# Patient Record
Sex: Female | Born: 1990 | Race: White | Hispanic: No | Marital: Single | Smoking: Never smoker
Health system: Southern US, Community
[De-identification: ages and names within clinical notes are randomized; demographics above are authoritative.]

---
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
## 2010-03-28 IMAGING — CR DG THORACIC SPINE 3V
2 series · 2 of 2 positions shown · non-contrast
Comparison: none

CLINICAL DATA: Pain.  Assess for scoliosis.

THORACIC SPINE - 2 VIEW + SWIMMERS

[view not recorded (1 of 2)]
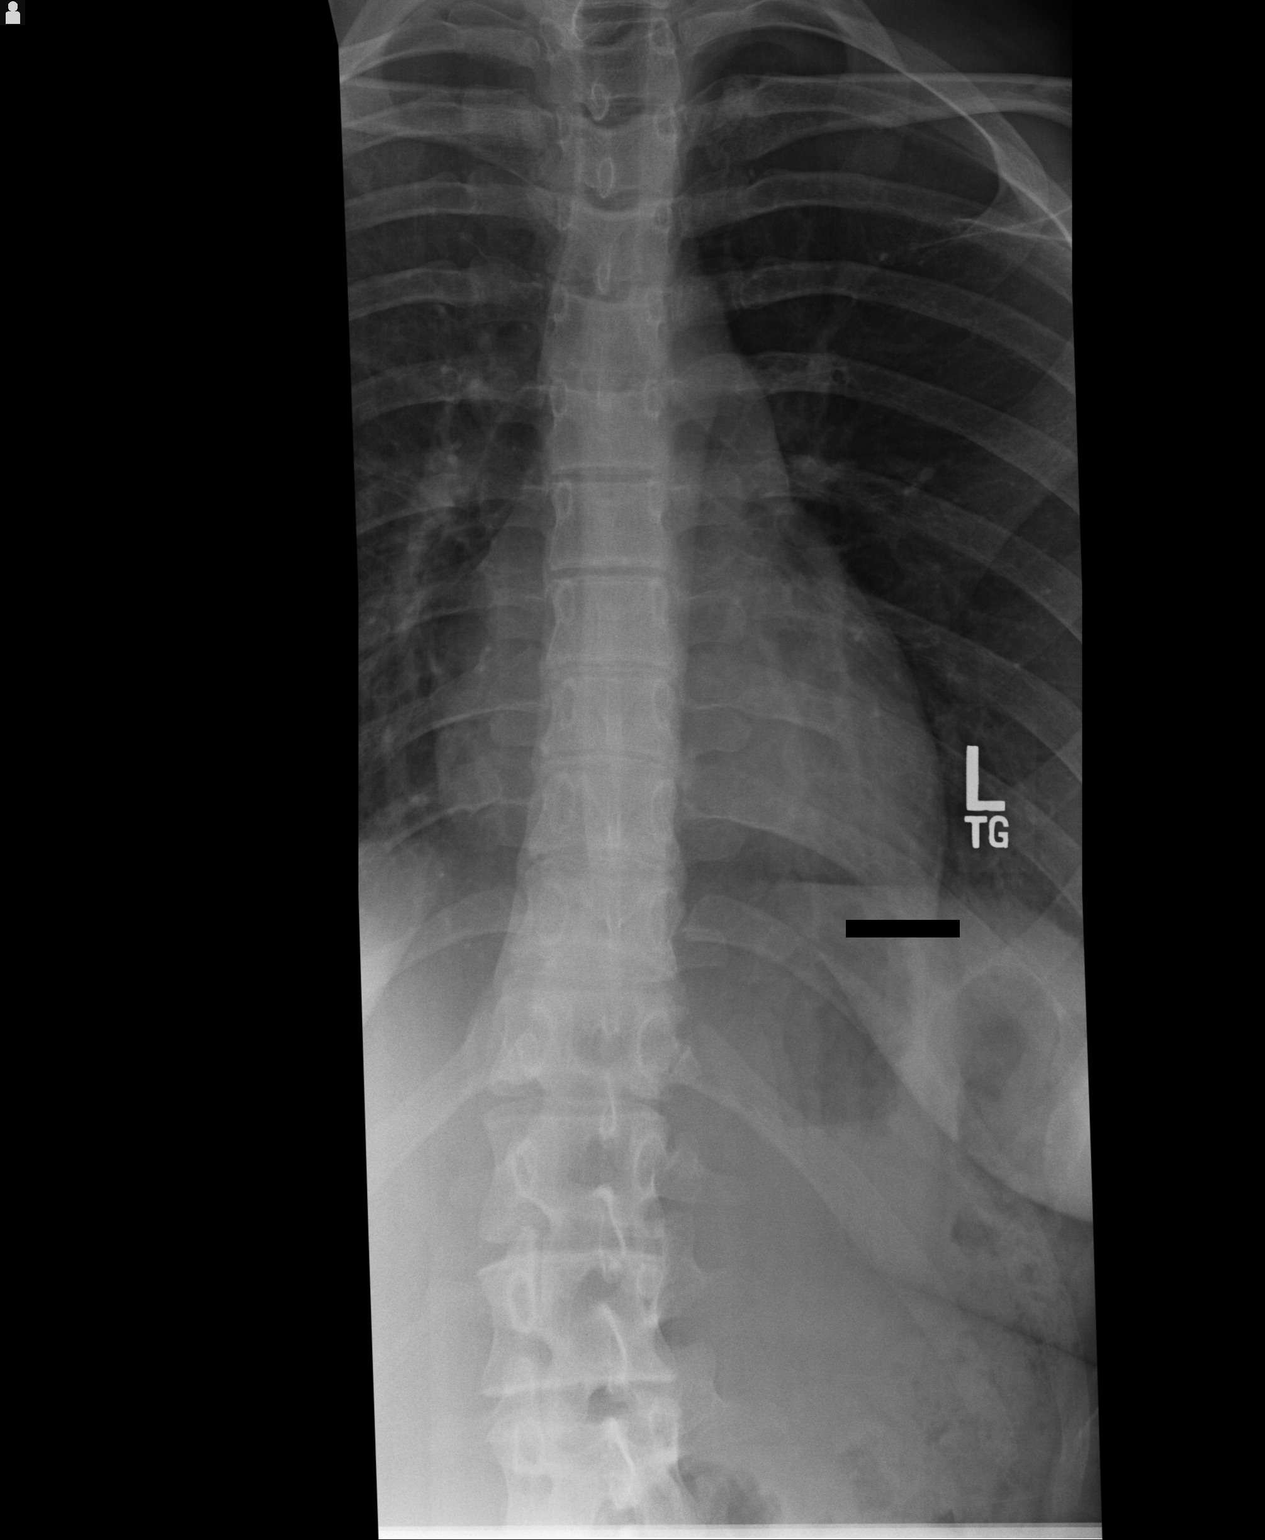

[view not recorded (2 of 2)]
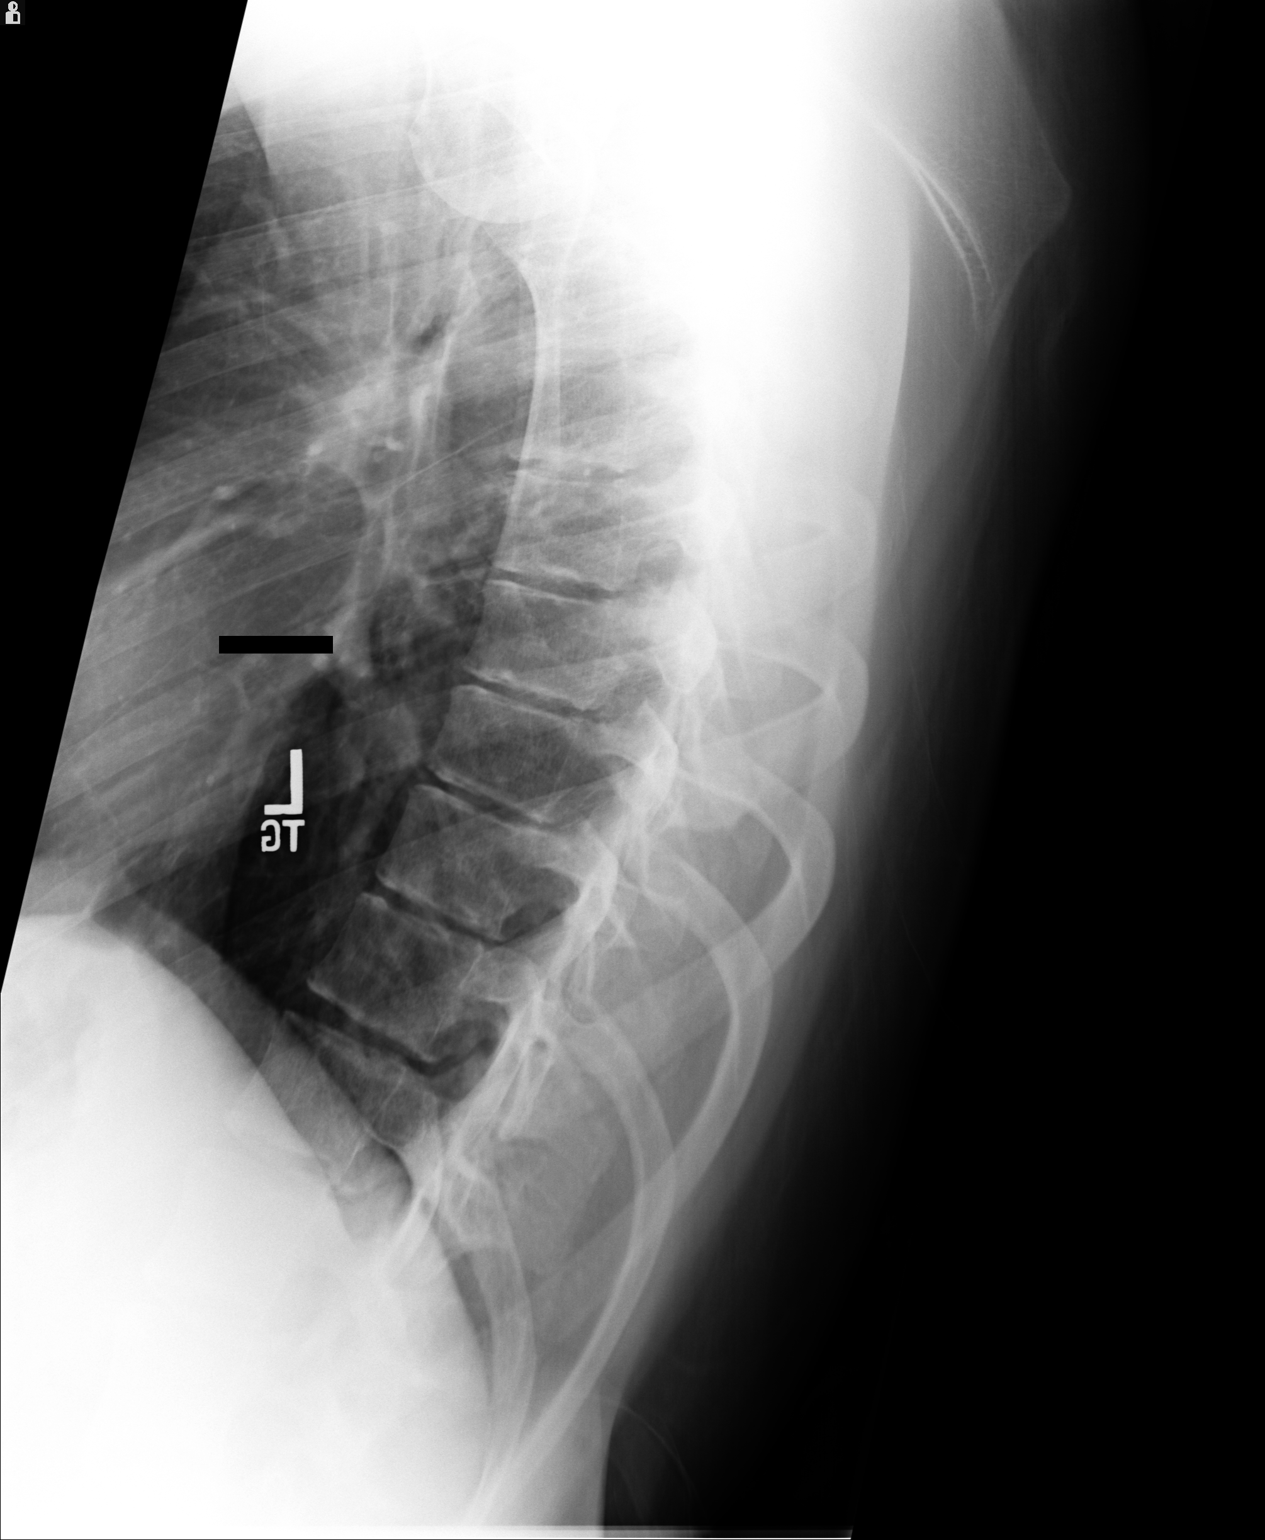

[2 of 2 positions shown; findings below may reference images not displayed]

FINDINGS: Upper thoracic region is not included on the film.
Cervical films showed a dextroscoliosis in that area.  Lower in the
thoracic spine, there is minor S-shaped curvature measured at 7
degrees.  The thoracolumbar curvatures measured at 3 degrees.
Lateral projection shows normal alignment with no disc space
narrowing.
IMPRESSION: Moderate curvatures of the thoracic spine as described above.
Scanogram might be more useful to depict the entire spine.

## 2020-03-26 DIAGNOSIS — F900 Attention-deficit hyperactivity disorder, predominantly inattentive type: Secondary | ICD-10-CM | POA: Insufficient documentation

## 2021-05-06 NOTE — Progress Notes (Signed)
? ? ? ?05/06/2021 ?Sandra Romero ?[id number] ?08-22-1990 ? ? ?CHIEF COMPLAINT: Rumination syndrome ? ?HISTORY OF PRESENT ILLNESS: Sandra Romero is a 31 year old female with a past medical history of anxiety, depression, bulimia x 6 to 7 months 2006, anorexia 2015 -  2019, GERD and anemia.  Past surgical history includes wisdom teeth extraction.  She presents to our office today as referred by Dr. Rachell Romero for further evaluation regarding GERD and rumination syndrome. She initially experienced regurgitation symptoms following 6 to 7 months of actively purging with history of bulemia around age 31 or 31.  Several months after she stopped inducing vomiting, she developed intermittent "involuntary vomiting" which typically occurred if she was not relaxed.  She was treated for possible reflux and she took Prilosec for approximately 1 year without improvement.  Her regurgitation symptoms have worsened over the past 6 months.  She complains of food spontaneously backing up into her esophagus into her mouth which occurs at least once daily shortly after eating for the past 6 months. She denies having associated nausea.  No dry heaves or active vomiting.  No purging.  No specific food triggers.  She denies having heartburn.  She sometimes feels as if food gets stuck in her throat or upper esophagus but she is able to drink water and continue eating during these times.  No upper or lower abdominal pain.  She is passing normal formed brown bowel movement daily.  No rectal bleeding or black stools.  She reports having a remote history of anemia secondary to heavy menses and poor nutrition.  Denies any recent weight loss. ? ?Labs 04/13/2021 (results obtained from patient's phone): Hemoglobin A1c 5.5.  Glucose 97.  Sodium 137.  Potassium 4.5.  BUN 19.  Creatinine 0.85.  Calcium 9.5.  Albumin 4.3.  Alk phos 36.  AST 14.  ALT 12.  Total bili 0.2. ? ?Social History: Single. She is a Pharmacist, hospital, Electrical engineer. Nonsmoker. She  drinks one glass of wine or beer once or twice monthly. No drug use.  ? ?Family History: Mother age 77 had breast cancer and thyroid disease. Father age 65 with history of prediabetes. Maternal grandmother had breast cancer and depression. Paternal grandfather had bladder cancer. Maternal grandfather had MI in his 63's. Brother depression. No fami ? ?Current Outpatient Medications on File Prior to Visit  ?Medication Sig Dispense Refill  ? lisdexamfetamine (VYVANSE) 20 MG capsule Vyvanse 20 mg capsule    ? ?No current facility-administered medications on file prior to visit.  ? ? ?Allergies  ?Allergen Reactions  ? Penicillins Rash  ? ?REVIEW OF SYSTEMS: ?Gen: Denies fever, sweats or chills. No weight loss.  ?CV: Denies chest pain, palpitations or edema. ?Resp: Denies cough, shortness of breath of hemoptysis.  ?GI: See HPI. ?GU : Denies urinary burning, blood in urine, increased urinary frequency or incontinence. ?MS: Denies joint pain, muscles aches or weakness. ?Derm: Denies rash, itchiness, skin lesions or unhealing ulcers. ?Psych: History of anxiety and depression. Remote history of bulimia and anorexia. Heme: Denies bruising, easy bleeding. ?Neuro:  Denies headaches, dizziness or paresthesias. ?Endo:  Denies any problems with DM, thyroid or adrenal function. ? ?PHYSICAL EXAM: ?BP 102/60   Pulse 67   Ht _0  (1.803 m)   Wt 174 lb (78.9 kg)   SpO2 99%   BMI 24.27 kg/m?  ? ?  ?LMP 30 days ago. Copper IUD in place.  ? ?General: 31 year old female in no acute distress. ?Head: Normocephalic and atraumatic. ?Eyes:  Sclerae non-icteric, conjunctive pink. ?Ears: Normal auditory acuity. ?Mouth: Dentition intact. No ulcers or lesions.  ?Neck: Supple, no lymphadenopathy or thyromegaly.  ?Lungs: Clear bilaterally to auscultation without wheezes, crackles or rhonchi. ?Heart: Regular rate and rhythm. No murmur, rub or gallop appreciated.  ?Abdomen: Soft, nontender, non distended. No masses. No hepatosplenomegaly.  Normoactive bowel sounds x 4 quadrants.  ?Rectal: Deferred. ?Musculoskeletal: Symmetrical with no gross deformities. ?Skin: Warm and dry. No rash or lesions on visible extremities. ?Extremities: No edema. ?Neurological: Alert oriented x 4, no focal deficits.  ?Psychological:  Alert and cooperative. Normal mood and affect. ? ?ASSESSMENT AND PLAN: ? ?70) 31 year old female with a remote history of bulimia/anorexia with infrequent regurgitation since the age of 53. Worsening regurgitation without dry heaves or true vomiting which occurs daily for the past 6 months.  Questional dysphagia.  No classic heartburn.  No nausea or abdominal pain. ?-Barium swallow study to rule out esophageal dysmotility ?-EGD to rule out GERD/esophagitis/achalasia.  Possible esophageal dilatation.  EGD benefits and risks discussed including risk with sedation, risk of bleeding, perforation and infection  ?-Continue Omeprazole 20 mg daily for now ?-Recommended eating 4 small snacks sized meals ?-Further recommendation to be determined after the above evaluation completed ? ? ? ? ? ? ?CC:  Fanny Bien, MD ? ? ? ?

## 2021-05-10 ENCOUNTER — Ambulatory Visit: Payer: BC Managed Care – PPO | Admitting: Nurse Practitioner

## 2021-05-10 ENCOUNTER — Encounter: Payer: Self-pay | Admitting: Nurse Practitioner

## 2021-05-10 VITALS — BP 102/60 | HR 67 | Ht 71.0 in | Wt 174.0 lb

## 2021-05-10 DIAGNOSIS — R1110 Vomiting, unspecified: Secondary | ICD-10-CM | POA: Diagnosis not present

## 2021-05-10 NOTE — Progress Notes (Signed)
RADIOLOGY SCHEDULING REQUEST FOR Barium Swallow with tablet sent to [facility] Scheduling via secure staff message. ?Reminder set to ensure appointment gets scheduled. ? ? ? ?

## 2021-05-10 NOTE — Patient Instructions (Addendum)
1) Our office will contact you to schedule a barium swallow study  ? ?2) Proceed with an upper endoscopy as scheduled  ? ?3) Continue Omeprazole 20mg  once daily for now  ? ?4) Recommend eating 4 small snack sized meals  ? ?5) Contact our office if your symptoms worsen  ? ?You have been scheduled for a EGD. Please follow the written instructions given to you at your visit today. ?If you use inhalers (even only as needed), please bring them with you on the day of your procedure. ? ?Thank you for trusting me with your gastrointestinal care!   ? ?Noralyn Pick, CRNP ? ? ? ?BMI: ? ?If you are age 36 or older, your body mass index should be between 23-30. Your Body mass index is 24.27 kg/m?Marland Kitchen If this is out of the aforementioned range listed, please consider follow up with your Primary Care Provider. ? ?If you are age 21 or younger, your body mass index should be between 19-25. Your Body mass index is 24.27 kg/m?Marland Kitchen If this is out of the aformentioned range listed, please consider follow up with your Primary Care Provider.  ? ?MY CHART: ? ?The [facility] providers would like to encourage you to use [facility] to communicate with providers for non-urgent requests or questions.  Due to long hold times on the telephone, sending your provider a message by [facility] may be a faster and more efficient way to get a response.  Please allow 48 business hours for a response.  Please remember that this is for non-urgent requests.  ? ?

## 2021-05-11 NOTE — Progress Notes (Signed)
Agree with the assessment and plan as outlined by Colleen Kennedy-Smith, NP.    Anabia Weatherwax E. Khan Chura, MD [facility]  

## 2021-05-31 ENCOUNTER — Ambulatory Visit (HOSPITAL_COMMUNITY): Payer: BC Managed Care – PPO

## 2021-06-07 ENCOUNTER — Ambulatory Visit (HOSPITAL_COMMUNITY)
Admission: RE | Admit: 2021-06-07 | Discharge: 2021-06-07 | Disposition: A | Discharge status: Home / Self Care | Payer: BC Managed Care – PPO | Source: Ambulatory Visit | Attending: Nurse Practitioner | Admitting: Nurse Practitioner

## 2021-06-07 DIAGNOSIS — R1110 Vomiting, unspecified: Secondary | ICD-10-CM | POA: Diagnosis present

## 2021-06-08 ENCOUNTER — Encounter: Payer: Self-pay | Admitting: Gastroenterology

## 2021-06-13 ENCOUNTER — Encounter: Payer: Self-pay | Admitting: Gastroenterology

## 2021-06-13 ENCOUNTER — Ambulatory Visit (AMBULATORY_SURGERY_CENTER): Payer: BC Managed Care – PPO | Admitting: Gastroenterology

## 2021-06-13 VITALS — BP 99/58 | HR 67 | Temp 98.0°F | Resp 22 | Ht 71.0 in | Wt 174.0 lb

## 2021-06-13 DIAGNOSIS — K200 Eosinophilic esophagitis: Secondary | ICD-10-CM

## 2021-06-13 DIAGNOSIS — R1110 Vomiting, unspecified: Secondary | ICD-10-CM

## 2021-06-13 DIAGNOSIS — K209 Esophagitis, unspecified without bleeding: Secondary | ICD-10-CM | POA: Diagnosis not present

## 2021-06-13 MED ORDER — SODIUM CHLORIDE 0.9 % IV SOLN: 500.0000 mL | Freq: Once | INTRAVENOUS | Status: DC

## 2021-06-13 NOTE — Op Note (Signed)
[facility] ?Patient Name: Sandra RoughenRachel Romero ?Procedure Date: 06/13/2021 2:59 PM ?MRN: [id number] ?Endoscopist: Skylin Kennerson E. Tomasa Randunningham , MD ?Age: 31 ?Referring MD:  ?Date of Birth: 10-22-1990 ?Gender: Female ?Account #: [id number] ?Procedure:                Upper GI endoscopy ?Indications:              Regurgitation ?Medicines:                Monitored Anesthesia Care ?Procedure:                Pre-Anesthesia Assessment: ?                          - Prior to the procedure, a History and Physical  ?                          was performed, and patient medications and  ?                          allergies were reviewed. The patient's tolerance of  ?                          previous anesthesia was also reviewed. The risks  ?                          and benefits of the procedure and the sedation  ?                          options and risks were discussed with the patient.  ?                          All questions were answered, and informed consent  ?                          was obtained. Prior Anticoagulants: The patient has  ?                          taken no previous anticoagulant or antiplatelet  ?                          agents. ASA Grade Assessment: II - A patient with  ?                          mild systemic disease. After reviewing the risks  ?                          and benefits, the patient was deemed in  ?                          satisfactory condition to undergo the procedure. ?                          After obtaining informed consent, the endoscope was  ?  passed under direct vision. Throughout the  ?                          procedure, the patient's blood pressure, pulse, and  ?                          oxygen saturations were monitored continuously. The  ?                          GIF [id number] #[id number] was introduced through the  ?                          mouth, and advanced to the third part of duodenum.  ?                          The upper GI endoscopy was  accomplished without  ?                          difficulty. The patient tolerated the procedure  ?                          well. ?Scope In: ?Scope Out: ?Findings:                 The examined portions of the nasopharynx,  ?                          oropharynx and larynx were normal. ?                          Scattered mild mucosal changes characterized by  ?                          longitudinal markings were found in the lower third  ?                          of the esophagus. Biopsies were taken with a cold  ?                          forceps for histology. Estimated blood loss was  ?                          minimal. ?                          The exam of the esophagus was otherwise normal. ?                          The entire examined stomach was normal. ?                          The examined duodenum was normal. ?Complications:            No immediate complications. ?Estimated Blood Loss:     Estimated blood loss was minimal. ?Impression:               -  The examined portions of the nasopharynx,  ?                          oropharynx and larynx were normal. ?                          - Longitudinally marked mucosa in the esophagus.  ?                          Biopsied. ?                          - Normal stomach. ?                          - Normal examined duodenum. ?                          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                          emergencies. The signs and symptoms of potential  ?                          delayed complications were discussed with the  ?                          patient. Return to normal activities tomorrow.  ?                          Written discharge instructions were provided to the  ?                          patient. ?                          - Resume previous diet. ?                          - Continue present medications. ?                           - Await pathology results. ?                          - Recommend diaphragmatic breathing exercises and  ?                          referral to psychology for further treatment if not  ?                          improving. ?Deloras Reichard E. Tomasa Rand, MD ?06/13/2021 3:38:13 PM ?This report has been signed electronically. ?

## 2021-06-13 NOTE — Patient Instructions (Signed)
Read all of the handouts given to you by your recovery room nurse. ° °YOU HAD AN ENDOSCOPIC PROCEDURE TODAY AT THE [facility]:   Refer to the procedure report that was given to you for any specific questions about what was found during the examination.  If the procedure report does not answer your questions, please call your gastroenterologist to clarify.  If you requested that your care partner not be given the details of your procedure findings, then the procedure report has been included in a sealed envelope for you to review at your convenience later. ° °YOU SHOULD EXPECT: Some feelings of bloating in the abdomen. Passage of more gas than usual.  Walking can help get rid of the air that was put into your GI tract during the procedure and reduce the bloating.  ° °Please Note:  You might notice some irritation and congestion in your nose or some drainage.  This is from the oxygen used during your procedure.  There is no need for concern and it should clear up in a day or so. ° °SYMPTOMS TO REPORT IMMEDIATELY: ° ° °Following upper endoscopy (EGD) ° Vomiting of blood or coffee ground material ° New chest pain or pain under the shoulder blades ° Painful or persistently difficult swallowing ° New shortness of breath ° Fever of 100°F or higher ° Black, tarry-looking stools ° °For urgent or emergent issues, a gastroenterologist can be reached at any hour by calling [phone]. °Do not use MyChart messaging for urgent concerns.  ° ° °DIET:  We do recommend a small meal at first, but then you may proceed to your regular diet.  Drink plenty of fluids but you should avoid alcoholic beverages for 24 hours. ° °ACTIVITY:  You should plan to take it easy for the rest of today and you should NOT DRIVE or use heavy machinery until tomorrow (because of the sedation medicines used during the test).   ° °FOLLOW UP: °Our staff will call the number listed on your records 48-72 hours following your procedure to check  on you and address any questions or concerns that you may have regarding the information given to you following your procedure. If we do not reach you, we will leave a message.  We will attempt to reach you two times.  During this call, we will ask if you have developed any symptoms of COVID 19. If you develop any symptoms (ie: fever, flu-like symptoms, shortness of breath, cough etc.) before then, please call [phone].  If you test positive for Covid 19 in the 2 weeks post procedure, please call and report this information to us.   ° °If any biopsies were taken you will be contacted by phone or by letter within the next 1-3 weeks.  Please call us at [phone] if you have not heard about the biopsies in 3 weeks.  ° ° °SIGNATURES/CONFIDENTIALITY: °You and/or your care partner have signed paperwork which will be entered into your electronic medical record.  These signatures attest to the fact that that the information above on your After Visit Summary has been reviewed and is understood.  Full responsibility of the confidentiality of this discharge information lies with you and/or your care-partner.  °

## 2021-06-13 NOTE — Progress Notes (Signed)
Pt's states no medical or surgical changes since previsit or office visit. VS assessed by D.T 

## 2021-06-13 NOTE — Progress Notes (Signed)
To Pacu, VSS. Report to [email or web address] 

## 2021-06-13 NOTE — Progress Notes (Signed)
[facility] History and Physical ? ? ?Primary Care Physician:  Lewis Moccasin, MD ? ? 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 ?Plan:    EGD ? ? ? ? ?HPI: Sandra Romero is a 31 y.o. female with a history of bulimia and anorexia with chronic symptoms of regurgitating masticated food.  No retching or heaving and regurgitated food does not seem acidic.  No heartburn or acid regurgitation. ? ? ?History reviewed. No pertinent past medical history. ? ?History reviewed. No pertinent surgical history. ? ?Prior to Admission medications   ?Medication Sig Start Date End Date Taking? Authorizing Provider  ?lisdexamfetamine (VYVANSE) 20 MG capsule Vyvanse 20 mg capsule   Yes [provider]  ?Multiple Vitamins-Minerals (MULTIVITAMIN WITH MINERALS) tablet Take 1 tablet by mouth daily.   Yes [provider]  ? ? ?Current Outpatient Medications  ?Medication Sig Dispense Refill  ? lisdexamfetamine (VYVANSE) 20 MG capsule Vyvanse 20 mg capsule    ? Multiple Vitamins-Minerals (MULTIVITAMIN WITH MINERALS) tablet Take 1 tablet by mouth daily.    ? ?Current Facility-Administered Medications  ?Medication Dose Route Frequency Provider Last Rate Last Admin  ? 0.9 %  sodium chloride infusion  500 mL Intravenous Once Jenel Lucks, MD      ? ? ?Allergies as of 06/13/2021 - Review Complete 06/13/2021  ?Allergen Reaction Noted  ? Penicillins Rash 05/11/2015  ? ? ?Family History  ?Problem Relation Age of Onset  ? Colon cancer Neg Hx   ? Esophageal cancer Neg Hx   ? Liver cancer Neg Hx   ? Pancreatic cancer Neg Hx   ? Stomach cancer Neg Hx   ? ? ?Social History  ? ?Socioeconomic History  ? Marital status: Single  ?  Spouse name: Not on file  ? Number of children: Not on file  ? Years of education: Not on file  ? Highest education level: Not on file  ?Occupational History  ? Not on file  ?Tobacco Use  ? Smoking status: Never  ? Smokeless tobacco: Never  ?Substance and  Sexual Activity  ? Alcohol use: Not on file  ? Drug use: Not on file  ? Sexual activity: Not on file  ?Other Topics Concern  ? Not on file  ?Social History Narrative  ? Not on file  ? ?Social Determinants of Health  ? ?Financial Resource Strain: Not on file  ?Food Insecurity: Not on file  ?Transportation Needs: Not on file  ?Physical Activity: Not on file  ?Stress: Not on file  ?Social Connections: Not on file  ?Intimate Partner Violence: Not on file  ? ? ?Review of Systems: ? ?All other review of systems negative except as mentioned in the HPI. ? ?Physical Exam: ?Vital signs ?BP 113/72   Pulse 67   Temp 98 ?F (36.7 ?C) (Skin)   Resp 10   Ht 5\' 11"  (1.803 m)   Wt 174 lb (78.9 kg)   SpO2 100%   BMI 24.27 kg/m?  ? ?General:   Alert,  Well-developed, well-nourished, pleasant and cooperative in NAD ?Airway:  Mallampati 2 ?Lungs:  Clear throughout to auscultation.   ?Heart:  Regular rate and rhythm; no murmurs, clicks, rubs,  or gallops. ?Abdomen:  Soft, nontender and nondistended. Normal bowel sounds.   ?Neuro/Psych:  Normal mood and affect. A and O x 3 ? ? ?Aili Casillas E. , MD ?[facility] ? ?

## 2021-06-15 ENCOUNTER — Telehealth: Payer: Self-pay | Admitting: *Deleted

## 2021-06-15 ENCOUNTER — Telehealth: Payer: Self-pay

## 2021-06-15 NOTE — Telephone Encounter (Signed)
Attempted to reach patient for post-procedure f/u call. No answer. Voicemail box is full so unable to leave message. ?

## 2021-06-15 NOTE — Telephone Encounter (Signed)
?  Follow up Call- ? ? ?  06/13/2021  ?  2:52 PM  ?Call back number  ?Post procedure Call Back phone  # [phone]  ?Permission to leave phone message Yes  ?  ? ?Patient questions: ? ? ?Mailbox is full. ?

## 2021-06-18 NOTE — Progress Notes (Signed)
Fleet Contras,  ?The biopsies of your esophagus showed nonspecific inflammation.  Features of eosinophilic esophagitis were not seen.  The inflammatory changes were mild but most likely related to acid reflux.  I think a pH/impedance and esophageal manometry study would be helpful to further characterize the role of reflux and the clinical suspicion of rumination syndrome and recommend you complete these tests off PPI therapy. ?We can discuss this further in the office or via telephone if you like. ? ?Bonita Quin,  ?Can you please place an order for pH/impedance/manometry off PPI? ?

## 2022-10-23 ENCOUNTER — Other Ambulatory Visit (HOSPITAL_BASED_OUTPATIENT_CLINIC_OR_DEPARTMENT_OTHER): Payer: Self-pay

## 2022-10-23 MED ORDER — LISDEXAMFETAMINE DIMESYLATE 20 MG PO CAPS
20.0000 mg | ORAL_CAPSULE | Freq: Every morning | ORAL | 0 refills | Status: AC
  Filled 2022-10-23: qty 30, 30d supply, fill #0

## 2022-10-23 MED ORDER — BUPROPION HCL ER (SR) 100 MG PO TB12
100.0000 mg | ORAL_TABLET | Freq: Every day | ORAL | 0 refills | Status: AC
  Filled 2022-10-23: qty 90, 90d supply, fill #0

## 2022-11-20 ENCOUNTER — Other Ambulatory Visit (HOSPITAL_BASED_OUTPATIENT_CLINIC_OR_DEPARTMENT_OTHER): Payer: Self-pay

## 2022-11-20 MED ORDER — LISDEXAMFETAMINE DIMESYLATE 20 MG PO CAPS
20.0000 mg | ORAL_CAPSULE | Freq: Every morning | ORAL | 0 refills | Status: AC
  Filled 2023-03-27: qty 30, 30d supply, fill #0

## 2022-11-20 MED ORDER — LISDEXAMFETAMINE DIMESYLATE 20 MG PO CAPS
20.0000 mg | ORAL_CAPSULE | Freq: Every morning | ORAL | 0 refills | Status: AC
  Filled 2023-02-20: qty 30, 30d supply, fill #0

## 2022-11-20 MED ORDER — LISDEXAMFETAMINE DIMESYLATE 20 MG PO CAPS
20.0000 mg | ORAL_CAPSULE | Freq: Every morning | ORAL | 0 refills | Status: AC
  Filled 2022-11-20 – 2022-12-06 (×2): qty 30, 30d supply, fill #0

## 2022-12-01 ENCOUNTER — Other Ambulatory Visit (HOSPITAL_BASED_OUTPATIENT_CLINIC_OR_DEPARTMENT_OTHER): Payer: Self-pay

## 2022-12-06 ENCOUNTER — Other Ambulatory Visit (HOSPITAL_BASED_OUTPATIENT_CLINIC_OR_DEPARTMENT_OTHER): Payer: Self-pay

## 2023-01-18 ENCOUNTER — Ambulatory Visit: Payer: BC Managed Care – PPO | Admitting: Sports Medicine

## 2023-02-19 ENCOUNTER — Other Ambulatory Visit (HOSPITAL_BASED_OUTPATIENT_CLINIC_OR_DEPARTMENT_OTHER): Payer: Self-pay

## 2023-02-19 MED ORDER — TRETINOIN 0.05 % EX CREA
1.0000 | TOPICAL_CREAM | Freq: Every day | CUTANEOUS | 11 refills | Status: AC
  Filled 2023-02-19: qty 20, 30d supply, fill #0
  Filled 2023-03-20: qty 20, 30d supply, fill #1
  Filled 2023-05-24: qty 20, 30d supply, fill #2

## 2023-02-19 MED ORDER — BUPROPION HCL ER (SR) 100 MG PO TB12
100.0000 mg | ORAL_TABLET | Freq: Every day | ORAL | 3 refills | Status: AC
  Filled 2023-02-19: qty 90, 90d supply, fill #0
  Filled 2023-03-20: qty 90, 90d supply, fill #1

## 2023-02-19 MED ORDER — LISDEXAMFETAMINE DIMESYLATE 20 MG PO CAPS: ORAL_CAPSULE | Freq: Every day | ORAL | 0 refills | Status: AC

## 2023-02-20 ENCOUNTER — Other Ambulatory Visit (HOSPITAL_BASED_OUTPATIENT_CLINIC_OR_DEPARTMENT_OTHER): Payer: Self-pay

## 2023-03-20 ENCOUNTER — Other Ambulatory Visit: Payer: Self-pay

## 2023-03-20 ENCOUNTER — Other Ambulatory Visit (HOSPITAL_BASED_OUTPATIENT_CLINIC_OR_DEPARTMENT_OTHER): Payer: Self-pay

## 2023-03-27 ENCOUNTER — Other Ambulatory Visit (HOSPITAL_BASED_OUTPATIENT_CLINIC_OR_DEPARTMENT_OTHER): Payer: Self-pay

## 2023-05-24 ENCOUNTER — Other Ambulatory Visit (HOSPITAL_BASED_OUTPATIENT_CLINIC_OR_DEPARTMENT_OTHER): Payer: Self-pay

## 2023-05-24 ENCOUNTER — Other Ambulatory Visit: Payer: Self-pay

## 2023-05-24 MED ORDER — LISDEXAMFETAMINE DIMESYLATE 20 MG PO CAPS
20.0000 mg | ORAL_CAPSULE | Freq: Every morning | ORAL | 0 refills | Status: AC
  Filled 2023-05-24: qty 90, 90d supply, fill #0

## 2023-06-02 IMAGING — RF DG ESOPHAGUS
9 series · 14 of 24 positions shown · non-contrast
Comparison: UGI study February 2008 with slight episode of
gastroesophageal reflux, but otherwise normal.

CLINICAL DATA: Patient with PMH of anxiety, depression, bulimia,
GERD and anemia. Patient complains of regurgitation of foods after
eating occurs at least once a day for the past 6 months.

EXAM:
ESOPHAGUS/BARIUM SWALLOW/TABLET STUDY
TECHNIQUE: Combined double and single contrast examination was performed using
effervescent crystals, high-density barium, and thin liquid barium.
This exam was performed by Marytrude Hulleman, NP, and was supervised and
interpreted by Walker, Jeky.
FLUOROSCOPY:
Radiation Exposure Index (as provided by the fluoroscopic device):
7.9 mGy Kerma

[Series 1: cp_standard · 1 of 23 frames shown (1 of 9)]
[frame 4/23]
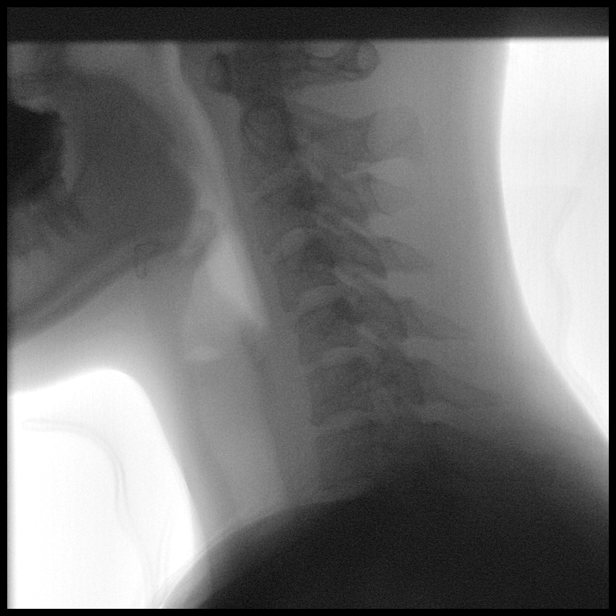

[Series 2: cp_standard · 1 of 26 frames shown (2 of 9)]
[frame 4/26]
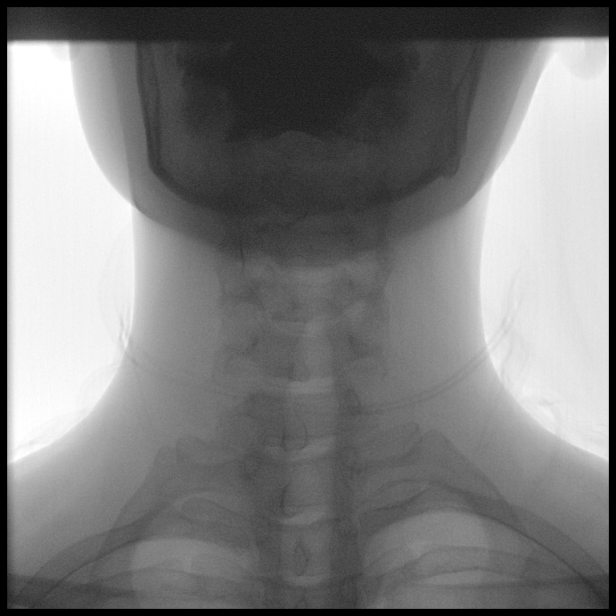

[Series 3: cp_standard · 2 of 177 frames shown (3 of 9)]
[frame 27/177]
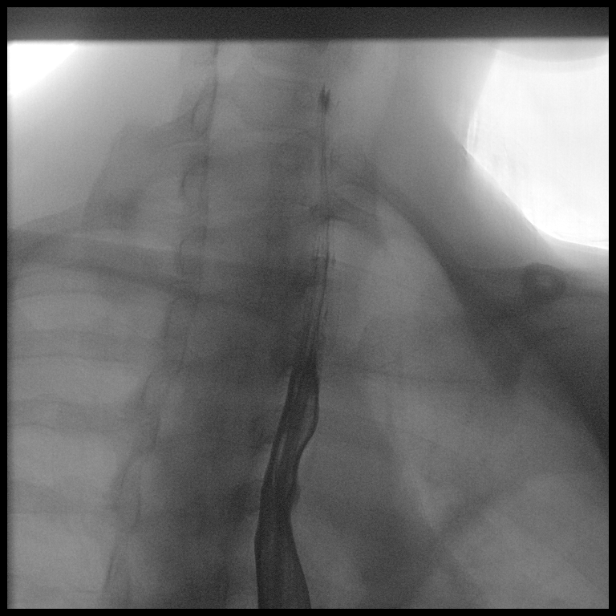
[frame 89/177]
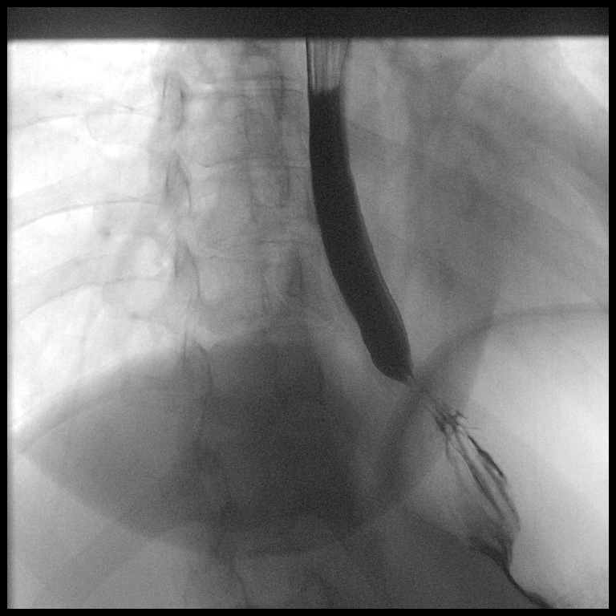

[Series 4: cp_standard · 2 of 81 frames shown (4 of 9)]
[frame 13/81]
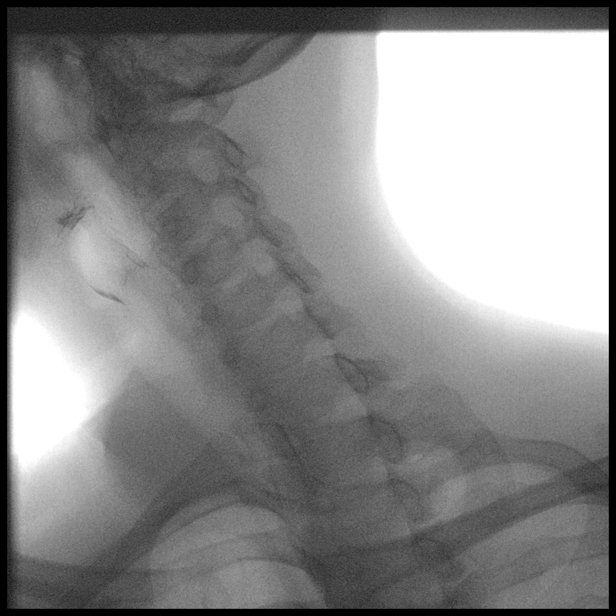
[frame 69/81]
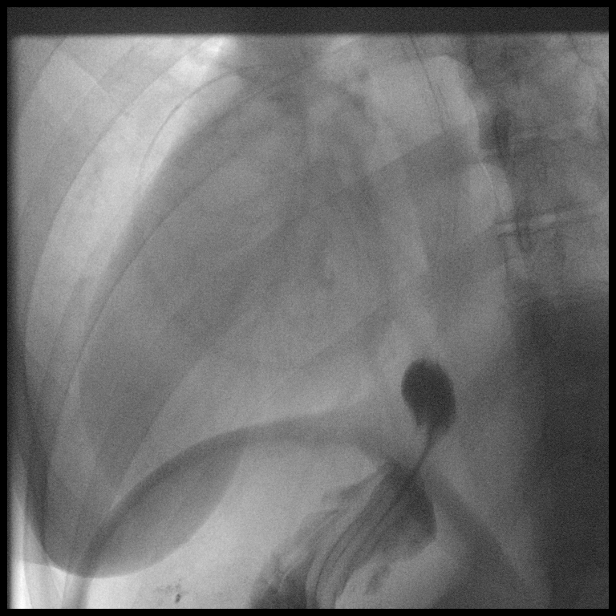

[Series 5: cp_standard · 2 of 220 frames shown (5 of 9)]
[frame 111/220]
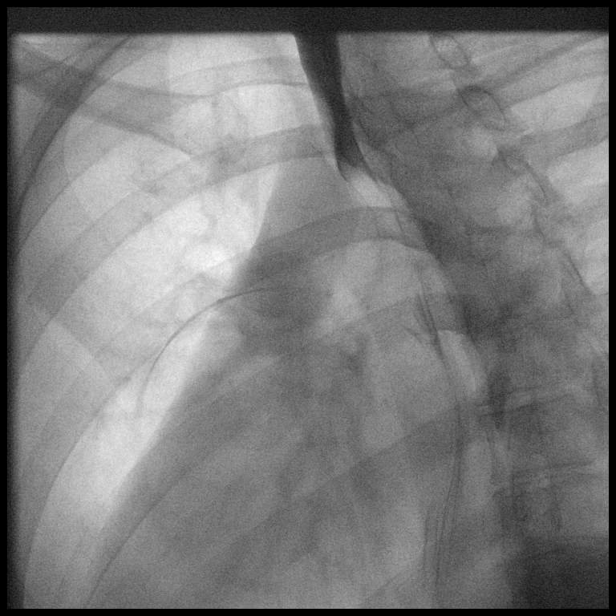
[frame 220/220]
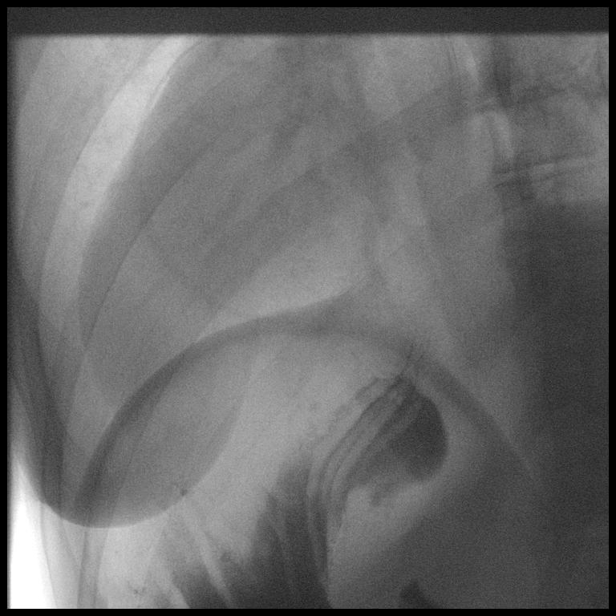

[Series 6: cp_standard · 1 of 38 frames shown (6 of 9)]
[frame 20/38]
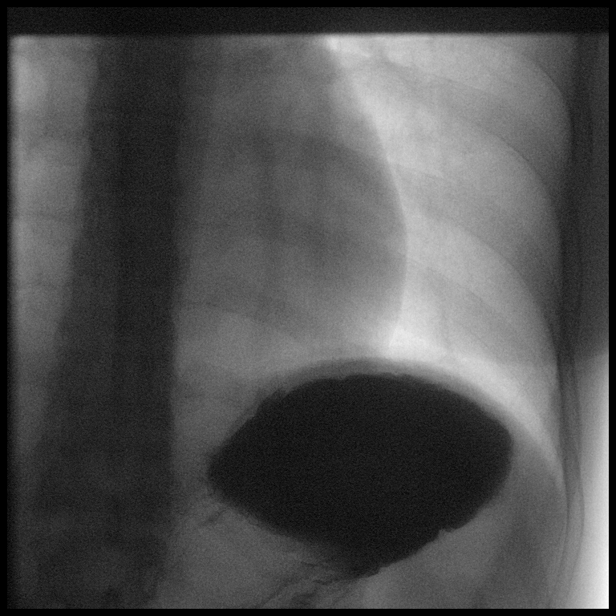

[Series 7: cp_standard · 2 of 34 frames shown (7 of 9)]
[frame 6/34]
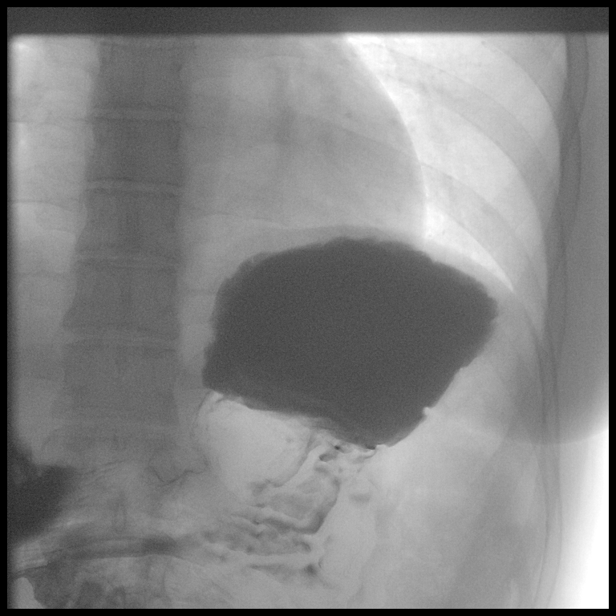
[frame 34/34]
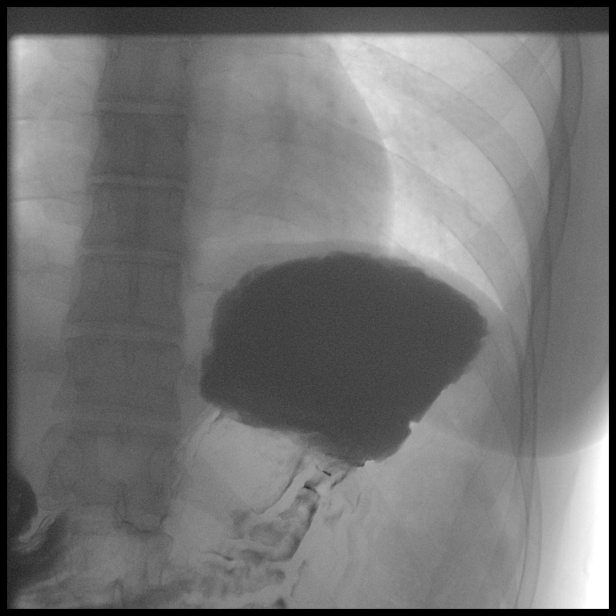

[Series 8: cp_standard · 2 of 75 frames shown (8 of 9)]
[frame 4/75]
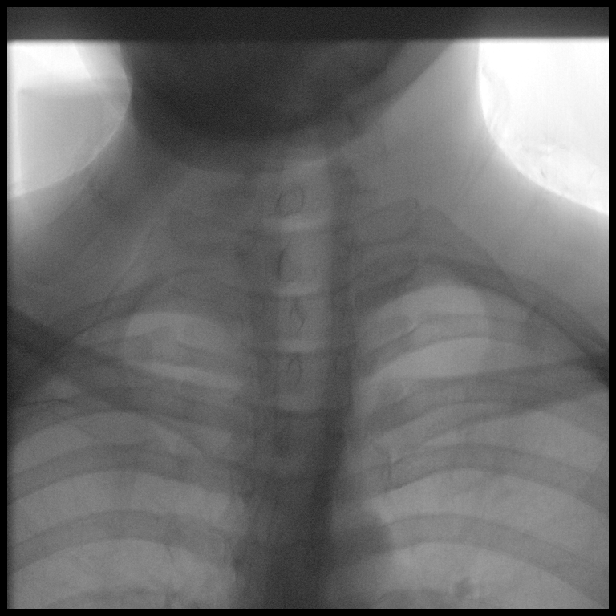
[frame 64/75]
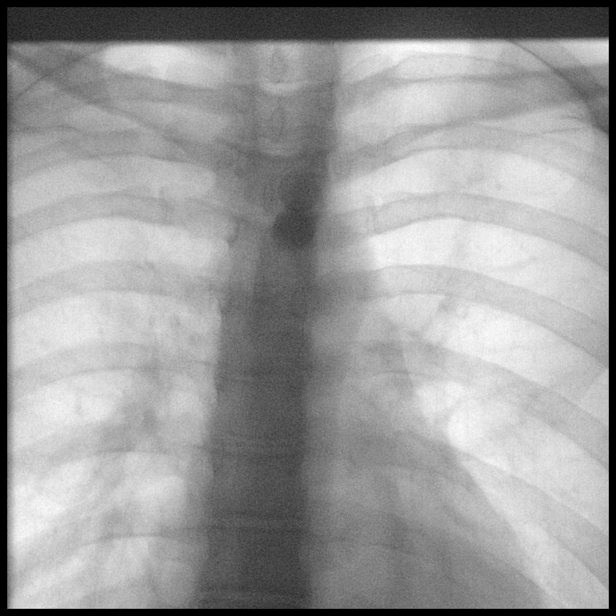

[Series 9: cp_standard · 1 of 25 frames shown (9 of 9)]
[frame 22/25]
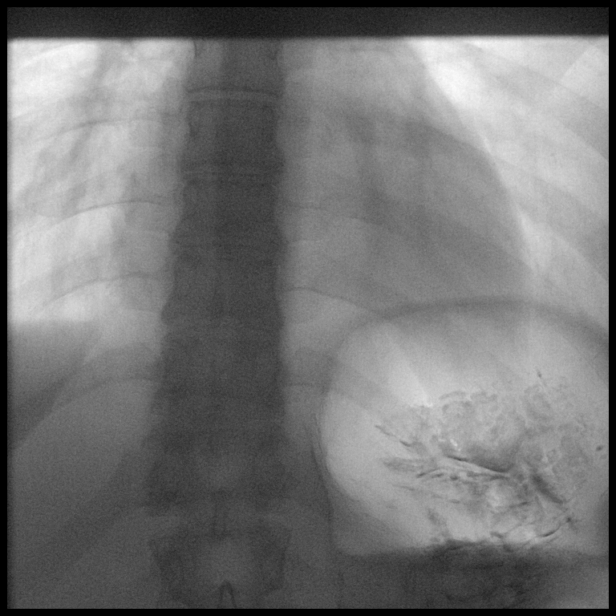

[14 of 24 positions shown; findings below may reference images not displayed]

FINDINGS: Swallowing: Appears normal. No vestibular penetration or aspiration
seen.

Pharynx: Unremarkable.

Esophagus: No esophageal mass or stricture. Normal contour and
caliber. Question mild fold thickening of the esophagus throughout
the esophagus.

Esophageal motility: Within normal limits.

Hiatal Hernia: None.

Gastroesophageal reflux: None visualized with provocation maneuvers.

Ingested 13mm barium tablet: Tablet had slight delay at the thoracic
aortic arch and then passed easily with swallow of water. This is
likely physiologic rather than related to true esophageal narrowing
based on other images.

Other: None.
IMPRESSION: Slight of 13 mm barium tablet at thoracic aortic arch that passed
easily with swallow water. This is felt to be physiologic.

Question mild esophagitis.

## 2023-08-23 ENCOUNTER — Other Ambulatory Visit (HOSPITAL_BASED_OUTPATIENT_CLINIC_OR_DEPARTMENT_OTHER): Payer: Self-pay

## 2023-08-24 ENCOUNTER — Other Ambulatory Visit (HOSPITAL_BASED_OUTPATIENT_CLINIC_OR_DEPARTMENT_OTHER): Payer: Self-pay

## 2023-08-24 ENCOUNTER — Encounter (HOSPITAL_BASED_OUTPATIENT_CLINIC_OR_DEPARTMENT_OTHER): Payer: Self-pay

## 2023-08-29 ENCOUNTER — Other Ambulatory Visit (HOSPITAL_BASED_OUTPATIENT_CLINIC_OR_DEPARTMENT_OTHER): Payer: Self-pay

## 2023-08-29 MED ORDER — BUPROPION HCL ER (SR) 100 MG PO TB12
100.0000 mg | ORAL_TABLET | Freq: Every day | ORAL | 3 refills | Status: AC
  Filled 2023-08-29: qty 90, 90d supply, fill #0

## 2023-08-29 MED ORDER — LISDEXAMFETAMINE DIMESYLATE 20 MG PO CAPS
20.0000 mg | ORAL_CAPSULE | Freq: Every day | ORAL | 0 refills | Status: AC
  Filled 2023-08-29: qty 90, 90d supply, fill #0

## 2023-08-31 ENCOUNTER — Other Ambulatory Visit (HOSPITAL_BASED_OUTPATIENT_CLINIC_OR_DEPARTMENT_OTHER): Payer: Self-pay

## 2023-09-03 ENCOUNTER — Other Ambulatory Visit (HOSPITAL_BASED_OUTPATIENT_CLINIC_OR_DEPARTMENT_OTHER): Payer: Self-pay

## 2023-12-03 ENCOUNTER — Other Ambulatory Visit (HOSPITAL_BASED_OUTPATIENT_CLINIC_OR_DEPARTMENT_OTHER): Payer: Self-pay

## 2023-12-03 MED ORDER — LISDEXAMFETAMINE DIMESYLATE 20 MG PO CAPS
20.0000 mg | ORAL_CAPSULE | Freq: Every morning | ORAL | 0 refills | Status: AC
  Filled 2023-12-03 – 2024-02-04 (×3): qty 90, 90d supply, fill #0

## 2023-12-13 ENCOUNTER — Other Ambulatory Visit (HOSPITAL_BASED_OUTPATIENT_CLINIC_OR_DEPARTMENT_OTHER): Payer: Self-pay

## 2024-01-03 ENCOUNTER — Other Ambulatory Visit (HOSPITAL_BASED_OUTPATIENT_CLINIC_OR_DEPARTMENT_OTHER): Payer: Self-pay

## 2024-01-03 MED ORDER — MECLIZINE HCL 12.5 MG PO TABS
12.5000 mg | ORAL_TABLET | Freq: Four times a day (QID) | ORAL | 0 refills | Status: AC
  Filled 2024-01-03: qty 60, 5d supply, fill #0

## 2024-01-14 ENCOUNTER — Other Ambulatory Visit (HOSPITAL_BASED_OUTPATIENT_CLINIC_OR_DEPARTMENT_OTHER): Payer: Self-pay

## 2024-01-23 ENCOUNTER — Other Ambulatory Visit (HOSPITAL_BASED_OUTPATIENT_CLINIC_OR_DEPARTMENT_OTHER): Payer: Self-pay

## 2024-01-23 MED ORDER — BUPROPION HCL ER (SR) 100 MG PO TB12
100.0000 mg | ORAL_TABLET | Freq: Every morning | ORAL | 1 refills | Status: AC
  Filled 2024-01-23: qty 90, 90d supply, fill #0

## 2024-01-23 MED ORDER — MUPIROCIN 2 % EX OINT
1.0000 | TOPICAL_OINTMENT | Freq: Three times a day (TID) | CUTANEOUS | 0 refills | Status: AC
  Filled 2024-01-23: qty 22, 14d supply, fill #0

## 2024-01-25 ENCOUNTER — Other Ambulatory Visit (HOSPITAL_BASED_OUTPATIENT_CLINIC_OR_DEPARTMENT_OTHER): Payer: Self-pay

## 2024-01-25 ENCOUNTER — Other Ambulatory Visit: Payer: Self-pay

## 2024-02-04 ENCOUNTER — Other Ambulatory Visit (HOSPITAL_BASED_OUTPATIENT_CLINIC_OR_DEPARTMENT_OTHER): Payer: Self-pay

## 2024-02-04 MED ORDER — OSELTAMIVIR PHOSPHATE 75 MG PO CAPS
75.0000 mg | ORAL_CAPSULE | Freq: Two times a day (BID) | ORAL | 0 refills | Status: AC
  Filled 2024-02-04: qty 10, 5d supply, fill #0
